# Patient Record
Sex: Male | Born: 1995 | Race: White | Hispanic: No | Marital: Single | Smoking: Former smoker
Health system: Southern US, Community
[De-identification: ages and names within clinical notes are randomized; demographics above are authoritative.]

## PROBLEM LIST (undated history)

## (undated) HISTORY — PX: MYRINGOTOMY: SUR874

---
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
## 2017-01-20 DIAGNOSIS — K122 Cellulitis and abscess of mouth: Secondary | ICD-10-CM | POA: Diagnosis not present

## 2017-01-20 DIAGNOSIS — Z719 Counseling, unspecified: Secondary | ICD-10-CM | POA: Diagnosis not present

## 2017-01-20 DIAGNOSIS — Z6820 Body mass index (BMI) 20.0-20.9, adult: Secondary | ICD-10-CM | POA: Diagnosis not present

## 2017-03-08 DIAGNOSIS — A498 Other bacterial infections of unspecified site: Secondary | ICD-10-CM | POA: Diagnosis not present

## 2017-03-08 DIAGNOSIS — Z1389 Encounter for screening for other disorder: Secondary | ICD-10-CM | POA: Diagnosis not present

## 2017-03-08 DIAGNOSIS — J302 Other seasonal allergic rhinitis: Secondary | ICD-10-CM | POA: Diagnosis not present

## 2017-03-08 DIAGNOSIS — J343 Hypertrophy of nasal turbinates: Secondary | ICD-10-CM | POA: Diagnosis not present

## 2017-11-19 DIAGNOSIS — J069 Acute upper respiratory infection, unspecified: Secondary | ICD-10-CM | POA: Diagnosis not present

## 2017-11-19 DIAGNOSIS — Z6822 Body mass index (BMI) 22.0-22.9, adult: Secondary | ICD-10-CM | POA: Diagnosis not present

## 2018-11-22 ENCOUNTER — Emergency Department (HOSPITAL_COMMUNITY): Payer: 59

## 2018-11-22 ENCOUNTER — Emergency Department (HOSPITAL_COMMUNITY)
Admission: EM | Admit: 2018-11-22 | Discharge: 2018-11-22 | Disposition: A | Discharge status: Home / Self Care | Payer: 59 | Attending: Emergency Medicine | Admitting: Emergency Medicine

## 2018-11-22 ENCOUNTER — Other Ambulatory Visit: Payer: Self-pay

## 2018-11-22 ENCOUNTER — Encounter (HOSPITAL_COMMUNITY): Payer: Self-pay

## 2018-11-22 DIAGNOSIS — W19XXXA Unspecified fall, initial encounter: Secondary | ICD-10-CM | POA: Insufficient documentation

## 2018-11-22 DIAGNOSIS — S161XXA Strain of muscle, fascia and tendon at neck level, initial encounter: Secondary | ICD-10-CM | POA: Diagnosis not present

## 2018-11-22 DIAGNOSIS — F17210 Nicotine dependence, cigarettes, uncomplicated: Secondary | ICD-10-CM | POA: Diagnosis not present

## 2018-11-22 DIAGNOSIS — X58XXXA Exposure to other specified factors, initial encounter: Secondary | ICD-10-CM | POA: Diagnosis not present

## 2018-11-22 DIAGNOSIS — M542 Cervicalgia: Secondary | ICD-10-CM | POA: Diagnosis not present

## 2018-11-22 DIAGNOSIS — Y999 Unspecified external cause status: Secondary | ICD-10-CM | POA: Diagnosis not present

## 2018-11-22 DIAGNOSIS — Y9389 Activity, other specified: Secondary | ICD-10-CM | POA: Insufficient documentation

## 2018-11-22 DIAGNOSIS — Y929 Unspecified place or not applicable: Secondary | ICD-10-CM | POA: Diagnosis not present

## 2018-11-22 DIAGNOSIS — R0789 Other chest pain: Secondary | ICD-10-CM | POA: Diagnosis not present

## 2018-11-22 DIAGNOSIS — S1980XA Other specified injuries of unspecified part of neck, initial encounter: Secondary | ICD-10-CM | POA: Diagnosis present

## 2018-11-22 DIAGNOSIS — S199XXA Unspecified injury of neck, initial encounter: Secondary | ICD-10-CM | POA: Diagnosis not present

## 2018-11-22 DIAGNOSIS — S299XXA Unspecified injury of thorax, initial encounter: Secondary | ICD-10-CM | POA: Diagnosis not present

## 2018-11-22 DIAGNOSIS — S0003XA Contusion of scalp, initial encounter: Secondary | ICD-10-CM | POA: Insufficient documentation

## 2018-11-22 DIAGNOSIS — R079 Chest pain, unspecified: Secondary | ICD-10-CM | POA: Diagnosis not present

## 2018-11-22 MED ORDER — TRAMADOL HCL 50 MG PO TABS
100.0000 mg | ORAL_TABLET | Freq: Once | ORAL | Status: AC
  Administered 2018-11-22: 100 mg via ORAL
  Filled 2018-11-22: qty 2

## 2018-11-22 MED ORDER — CYCLOBENZAPRINE HCL 10 MG PO TABS: 10.0000 mg | ORAL_TABLET | Freq: Three times a day (TID) | ORAL | 0 refills | Status: DC

## 2018-11-22 MED ORDER — KETOROLAC TROMETHAMINE 10 MG PO TABS
10.0000 mg | ORAL_TABLET | Freq: Once | ORAL | Status: AC
  Administered 2018-11-22: 10 mg via ORAL
  Filled 2018-11-22: qty 1

## 2018-11-22 MED ORDER — CYCLOBENZAPRINE HCL 10 MG PO TABS
10.0000 mg | ORAL_TABLET | Freq: Once | ORAL | Status: AC
  Administered 2018-11-22: 10 mg via ORAL
  Filled 2018-11-22: qty 1

## 2018-11-22 MED ORDER — ONDANSETRON HCL 4 MG PO TABS
4.0000 mg | ORAL_TABLET | Freq: Once | ORAL | Status: AC
  Administered 2018-11-22: 4 mg via ORAL
  Filled 2018-11-22: qty 1

## 2018-11-22 MED ORDER — DICLOFENAC SODIUM 75 MG PO TBEC: 75.0000 mg | DELAYED_RELEASE_TABLET | Freq: Two times a day (BID) | ORAL | 0 refills | Status: DC

## 2018-11-22 NOTE — ED Notes (Signed)
Attempted a back flip yesterday and caused injury to head, neck and sternum

## 2018-11-22 NOTE — ED Notes (Signed)
ED Provider at bedside. 

## 2018-11-22 NOTE — Discharge Instructions (Addendum)
Your blood pressure is slightly elevated.  Please have this rechecked soon.  Your neurologic examination shows no acute deficits.  The CT scan of your neck shows no fracture, or dislocation.  The x-ray of your chest is negative for bony abnormality, or lung abnormality.  Your examination suggest muscle strain involving the muscles in the neck area and chest area.  The examination also suggest a bruise or contusion to the back of the scalp area.  Please use Flexeril 3 times daily for spasm pain. This medication may cause drowsiness. Please do not drink, drive, or participate in activity that requires concentration while taking this medication.  Use diclofenac 2 times daily with food.  Please see your primary physician or return to the emergency department if any changes in your condition, problems, or concerns.

## 2018-11-22 NOTE — ED Triage Notes (Signed)
Pt did a back flip yesterday and hit the top of his head and neck. States he got to work today and could not work due to the pain.

## 2018-11-22 NOTE — ED Provider Notes (Signed)
[facility] EMERGENCY DEPARTMENT Provider Note   CSN: [id number] Arrival date & time: 11/22/18  1713    History   Chief Complaint Chief Complaint  Patient presents with  . Neck Pain    HPI Geoffrey Short is a 23 y.o. male.     Patient is a 23 year old male who presents to the emergency department with a complaint of injury following a back flip.  The patient states that on yesterday February 17 he attempted to do a back flip.  He says he is not done a back flip since his teenage years.  He injured his neck, he fell and injured the back of his head, he also states that he injured the sternum and upper chest.  There was no loss of consciousness reported.  There was no difficulty with walking after the flip.  The patient states however that he had pain in his upper chest and down the sternal area.  He had some pain between his shoulders.  He had some headache pain as well.  The patient states he was finally able to go to sleep.  This morning he continued to have some discomfort, but attempted to go to work, but was unable to do his usual work duties.  He presents now for assistance with his pain and also assessment of his injury.  The patient denies being on any anticoagulation medications.  He is not had any previous operations or procedures involving the head neck or chest.  The history is provided by the patient.  Neck Pain  Associated symptoms: headaches   Associated symptoms: no chest pain and no photophobia     History reviewed. No pertinent past medical history.  There are no active problems to display for this patient.   Past Surgical History:  Procedure Laterality Date  . MYRINGOTOMY          Home Medications    Prior to Admission medications   Not on File    Family History No family history on file.  Social History Social History   Tobacco Use  . Smoking status: Current Every Day Smoker    Packs/day: 0.50  . Smokeless tobacco: Current User    Types:  Chew  Substance Use Topics  . Alcohol use: Yes    Comment: 2 beers a day   . Drug use: Not Currently     Allergies   Patient has no allergy information on record.   Review of Systems Review of Systems  Constitutional: Negative for activity change.       All ROS Neg except as noted in HPI  HENT: Negative for nosebleeds.   Eyes: Negative for photophobia and discharge.  Respiratory: Negative for cough, shortness of breath and wheezing.        Chest wall pain  Cardiovascular: Negative for chest pain and palpitations.  Gastrointestinal: Negative for abdominal pain and blood in stool.  Genitourinary: Negative for dysuria, frequency and hematuria.  Musculoskeletal: Positive for neck pain. Negative for arthralgias and back pain.  Skin: Negative.   Neurological: Positive for headaches. Negative for dizziness, seizures and speech difficulty.  Psychiatric/Behavioral: Negative for confusion and hallucinations.     Physical Exam Updated Vital Signs BP (!) 162/88 (BP Location: Right Arm)   Pulse (!) 108   Temp 98.7 F (37.1 C) (Oral)   Resp 15   Ht 6\' 2"  (1.88 m)   Wt 90.7 kg   SpO2 98%   BMI 25.68 kg/m   Physical Exam Vitals signs  and nursing note reviewed.  Constitutional:      General: He is not in acute distress.    Appearance: He is well-developed.  HENT:     Head: Normocephalic. Contusion present. No raccoon eyes or Battle's sign.      Right Ear: External ear normal.     Left Ear: External ear normal.  Eyes:     General: No scleral icterus.       Right eye: No discharge.        Left eye: No discharge.     Conjunctiva/sclera: Conjunctivae normal.  Neck:     Musculoskeletal: Neck supple.     Trachea: No tracheal deviation.  Cardiovascular:     Rate and Rhythm: Normal rate and regular rhythm.  Pulmonary:     Effort: Pulmonary effort is normal. No respiratory distress.     Breath sounds: Normal breath sounds. No stridor. No wheezing or rales.  Abdominal:      General: Bowel sounds are normal. There is no distension.     Palpations: Abdomen is soft.     Tenderness: There is no abdominal tenderness. There is no guarding or rebound.  Musculoskeletal:        General: No tenderness.     Cervical back: He exhibits spasm.       Back:     Comments: There is tenderness of the paraspinal area in the cervical region.  There is no palpable step-off of the cervical, thoracic, or lumbar spine.  Skin:    General: Skin is warm and dry.     Findings: No rash.  Neurological:     Mental Status: He is alert.     Cranial Nerves: No cranial nerve deficit (no facial droop, extraocular movements intact, no slurred speech).     Sensory: No sensory deficit.     Motor: No abnormal muscle tone or seizure activity.     Coordination: Coordination normal.      ED Treatments / Results  Labs (all labs ordered are listed, but only abnormal results are displayed) Labs Reviewed - No data to display  EKG None  Radiology No results found.  Procedures Procedures (including critical care time)  Medications Ordered in ED Medications  cyclobenzaprine (FLEXERIL) tablet 10 mg (has no administration in time range)  ketorolac (TORADOL) tablet 10 mg (has no administration in time range)  traMADol (ULTRAM) tablet 100 mg (has no administration in time range)  ondansetron (ZOFRAN) tablet 4 mg (has no administration in time range)     Initial Impression / Assessment and Plan / ED Course  I have reviewed the triage vital signs and the nursing notes.  Pertinent labs & imaging results that were available during my care of the patient were reviewed by me and considered in my medical decision making (see chart for details).          Final Clinical Impressions(s) / ED Diagnoses MDM  Vital signs reviewed.  Pulse oximetry is 98% on room air.  Within normal limits by my interpretation.  Patient treated in the emergency department for his pain.  I explained to the  patient and family the testing that would be obtained, family is in agreement at this time.  X-ray of the chest is negative for any acute traumatic injury.  In particular no cardiopulmonary changes noted.  CT scan of the cervical spine is negative for acute problem.  Patient states he feels a lot better after medication.  Patient will be treated for muscle strain in  the neck, as well as contusion of the scalp and chest wall pain.  Prescription for Flexeril and diclofenac given to the patient to use.  The patient is to follow-up with the primary physician or return to the emergency department if any changes in condition, problems, or concerns.   Final diagnoses:  Acute strain of neck muscle, initial encounter  Contusion of scalp, initial encounter  Chest wall pain    ED Discharge Orders         Ordered    cyclobenzaprine (FLEXERIL) 10 MG tablet  3 times daily     11/22/18 2111    diclofenac (VOLTAREN) 75 MG EC tablet  2 times daily     11/22/18 2111           Ivery Quale, PA-C 11/23/18 0041    Jacalyn Lefevre, MD 11/23/18 1549

## 2018-11-22 NOTE — ED Notes (Signed)
Patient transported to CT 

## 2018-11-25 DIAGNOSIS — J029 Acute pharyngitis, unspecified: Secondary | ICD-10-CM | POA: Diagnosis not present

## 2018-11-25 DIAGNOSIS — Z6823 Body mass index (BMI) 23.0-23.9, adult: Secondary | ICD-10-CM | POA: Diagnosis not present

## 2019-10-03 ENCOUNTER — Ambulatory Visit: Payer: Private Health Insurance - Indemnity | Attending: Internal Medicine

## 2019-10-03 ENCOUNTER — Other Ambulatory Visit: Payer: Self-pay

## 2019-10-03 DIAGNOSIS — Z20822 Contact with and (suspected) exposure to covid-19: Secondary | ICD-10-CM

## 2019-10-04 LAB — NOVEL CORONAVIRUS, NAA: SARS-CoV-2, NAA: NOT DETECTED

## 2020-03-02 ENCOUNTER — Encounter (HOSPITAL_COMMUNITY): Payer: Self-pay | Admitting: *Deleted

## 2020-03-02 ENCOUNTER — Emergency Department (HOSPITAL_COMMUNITY)
Admission: EM | Admit: 2020-03-02 | Discharge: 2020-03-02 | Disposition: A | Discharge status: Home / Self Care | Payer: 59 | Attending: Emergency Medicine | Admitting: Emergency Medicine

## 2020-03-02 ENCOUNTER — Other Ambulatory Visit: Payer: Self-pay

## 2020-03-02 ENCOUNTER — Emergency Department (HOSPITAL_COMMUNITY): Payer: 59

## 2020-03-02 DIAGNOSIS — Z79899 Other long term (current) drug therapy: Secondary | ICD-10-CM | POA: Insufficient documentation

## 2020-03-02 DIAGNOSIS — Y9389 Activity, other specified: Secondary | ICD-10-CM | POA: Insufficient documentation

## 2020-03-02 DIAGNOSIS — F17220 Nicotine dependence, chewing tobacco, uncomplicated: Secondary | ICD-10-CM | POA: Insufficient documentation

## 2020-03-02 DIAGNOSIS — X509XXA Other and unspecified overexertion or strenuous movements or postures, initial encounter: Secondary | ICD-10-CM | POA: Insufficient documentation

## 2020-03-02 DIAGNOSIS — M542 Cervicalgia: Secondary | ICD-10-CM | POA: Insufficient documentation

## 2020-03-02 DIAGNOSIS — R202 Paresthesia of skin: Secondary | ICD-10-CM | POA: Insufficient documentation

## 2020-03-02 DIAGNOSIS — M25511 Pain in right shoulder: Secondary | ICD-10-CM | POA: Insufficient documentation

## 2020-03-02 DIAGNOSIS — Y9273 Farm field as the place of occurrence of the external cause: Secondary | ICD-10-CM | POA: Diagnosis not present

## 2020-03-02 MED ORDER — IBUPROFEN 800 MG PO TABS: 800.0000 mg | ORAL_TABLET | Freq: Three times a day (TID) | ORAL | 0 refills | Status: DC

## 2020-03-02 MED ORDER — HYDROCODONE-ACETAMINOPHEN 5-325 MG PO TABS: ORAL_TABLET | ORAL | 0 refills | Status: DC

## 2020-03-02 MED ORDER — IBUPROFEN 800 MG PO TABS: 800.0000 mg | ORAL_TABLET | Freq: Once | ORAL | Status: DC

## 2020-03-02 NOTE — ED Triage Notes (Signed)
Pt had a cow hit the gate while pt holding the gate causing shoulder pain and arm going limp.  Unable to lift arm and when moving his fingers causes pain to radiate up arm.

## 2020-03-02 NOTE — Discharge Instructions (Signed)
Your x-ray today was negative for fracture or dislocation.  You may have a ligamentous injury of your shoulder.  This will likely need orthopedic follow-up.  You may call Dr. Mort Sawyers office next week to arrange follow-up appointment.  Apply ice packs on and off your shoulder.

## 2020-03-02 NOTE — ED Provider Notes (Signed)
[facility] EMERGENCY DEPARTMENT Provider Note   CSN: [id number] Arrival date & time: 03/02/20  1351     History Chief Complaint  Patient presents with  . Shoulder Pain    Geoffrey RUDZINSKI is a 24 y.o. male.  HPI      Geoffrey Short is a 24 y.o. male who presents to the Emergency Department complaining of right shoulder pain secondary to an injury that occurred 2 hours prior to ER arrival.  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  He states that his shoulder was "slammed backwards" and he felt a pop in his shoulder and his arm suddenly went limp.  Since the incident, he reports that he is able to move his arm slightly but has pain when he tries to raise his arm outward.  He describes a throbbing pain along his posterior shoulder and into the muscles of his right neck.  He also describes a tingling sensation from his fingers that radiates toward his shoulder with gripping objects.  No prior shoulder injury to the right shoulder.  He denies head injury or LOC, neck pain or dizziness, chest or rib pain, or back pain.  No therapies prior to arrival.  History reviewed. No pertinent past medical history.  There are no problems to display for this patient.   Past Surgical History:  Procedure Laterality Date  . MYRINGOTOMY         History reviewed. No pertinent family history.  Social History   Tobacco Use  . Smoking status: Former Smoker    Packs/day: 0.50    Quit date: 10/20/2019    Years since quitting: 0.3  . Smokeless tobacco: Current User    Types: Chew  Substance Use Topics  . Alcohol use: Yes    Comment: 2 beers a day   . Drug use: Not Currently    Home Medications Prior to Admission medications   Medication Sig Start Date End Date Taking? Authorizing Provider  cyclobenzaprine (FLEXERIL) 10 MG tablet Take 1 tablet (10 mg total) by  mouth 3 (three) times daily. 11/22/18   Ivery Quale, PA-C  diclofenac (VOLTAREN) 75 MG EC tablet Take 1 tablet (75 mg total) by mouth 2 (two) times daily. 11/22/18   Ivery Quale, PA-C    Allergies    Patient has no known allergies.  Review of Systems   Review of Systems  Constitutional: Negative for chills and fever.  Eyes: Negative for visual disturbance.  Respiratory: Negative for shortness of breath.   Cardiovascular: Negative for chest pain.  Musculoskeletal: Positive for arthralgias (right shoulder pain) and neck pain (right neck pain). Negative for joint swelling.  Skin: Negative for color change, rash and wound.  Neurological: Negative for dizziness, syncope, weakness, numbness and headaches.    Physical Exam Updated Vital Signs BP 136/89 (BP Location: Left Arm)   Pulse 79   Temp (!) 97.1 F (36.2 C) (Temporal)   Resp 16   Ht 6\' 2"  (1.88 m)   Wt 98.9 kg   SpO2 97%   BMI 27.99 kg/m   Physical Exam Vitals and nursing note reviewed.  Constitutional:      Appearance: Normal appearance. He is not ill-appearing.  HENT:     Head: Atraumatic.  Neck:     Comments: No midline tenderness of the cervical spine. Cardiovascular:     Rate  and Rhythm: Normal rate and regular rhythm.     Pulses: Normal pulses.  Pulmonary:     Effort: Pulmonary effort is normal.     Breath sounds: Normal breath sounds.  Chest:     Chest wall: No tenderness.  Abdominal:     Palpations: Abdomen is soft.     Tenderness: There is no abdominal tenderness.  Musculoskeletal:        General: Tenderness and signs of injury present. No swelling or deformity.     Cervical back: Normal range of motion. Tenderness present.     Comments: Tender to palpation of the posterior right shoulder joint.  No bony step-off.  Mild tenderness to the right trapezius muscles.  No edema.  Pain reproduced with abduction.  Patient unable to abduct the right arm greater than 45 degrees without significant pain.  Right  elbow and wrist are nontender. Bilateral grip strength is strong and equal  Skin:    General: Skin is warm.     Capillary Refill: Capillary refill takes less than 2 seconds.     Findings: No erythema.  Neurological:     General: No focal deficit present.     Mental Status: He is alert.     Sensory: No sensory deficit.     Motor: No weakness.     ED Results / Procedures / Treatments   Labs (all labs ordered are listed, but only abnormal results are displayed) Labs Reviewed - No data to display  EKG None  Radiology DG Shoulder Right  Result Date: 03/02/2020 CLINICAL DATA:  Slammed into trailer gate EXAM: RIGHT SHOULDER - 2+ VIEW COMPARISON:  None. FINDINGS: Oblique and Y scapular images were obtained. No fracture or dislocation. Joint spaces appear normal. No erosive change. Visualized right lung clear. IMPRESSION: No fracture or dislocation.  No evident arthropathy. Electronically Signed   By: Lowella Grip III M.D.   On: 03/02/2020 14:43    Procedures Procedures (including critical care time)  Medications Ordered in ED Medications  ibuprofen (ADVIL) tablet 800 mg (has no administration in time range)    ED Course  I have reviewed the triage vital signs and the nursing notes.  Pertinent labs & imaging results that were available during my care of the patient were reviewed by me and considered in my medical decision making (see chart for details).    MDM Rules/Calculators/A&P                      Patient with mechanical injury of the right shoulder.  States that he felt a pop initially and another pop when he attempted to move his shoulder.  This may have represented a temporary dislocation with spontaneous reduction.  X-ray of the right shoulder today is negative for acute fracture or dislocation.  X-ray imaging reviewed by me.  He remains neurovascularly intact.  I have discussed possible ligamentous injury and the importance of close orthopedic follow-up.  He  verbalized understanding and agrees to this plan.  Short course of pain medication and anti-inflammatory provided.  Patient will be given referral information for local orthopedics.  Final Clinical Impression(s) / ED Diagnoses Final diagnoses:  Acute pain of right shoulder    Rx / DC Orders ED Discharge Orders    None       Kem Parkinson, PA-C 03/02/20 1609    Veryl Speak, MD 03/02/20 2031

## 2020-03-08 ENCOUNTER — Ambulatory Visit: Payer: 59 | Admitting: Orthopedic Surgery

## 2020-03-08 ENCOUNTER — Other Ambulatory Visit: Payer: Self-pay

## 2020-03-08 ENCOUNTER — Encounter: Payer: Self-pay | Admitting: Orthopedic Surgery

## 2020-03-08 VITALS — BP 137/81 | HR 84 | Ht 74.0 in | Wt 200.0 lb

## 2020-03-08 DIAGNOSIS — S43004A Unspecified dislocation of right shoulder joint, initial encounter: Secondary | ICD-10-CM

## 2020-03-08 NOTE — Patient Instructions (Signed)
Follow work 2 weeks

## 2020-03-08 NOTE — Progress Notes (Signed)
Chief Complaint  Patient presents with  . Shoulder Injury    ? 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  Since the shoulder was reduced no other treatment was recommended  He did have initial symptoms of numbness in his right arm which have resolved  Review of systems is negative  Review of Systems  All other systems reviewed and are negative.  BP 137/81   Pulse 84   Ht 6\' 2"  (1.88 m)   Wt 200 lb (90.7 kg)   BMI 25.68 kg/m   Normal development grooming and hygiene awake alert and oriented x3 normal appearance  Primary pain in the shoulder is reproduced with flexion adduction and posterior stress, he has pain in the central area of the shoulder with abduction external rotation but is apprehension test is negative he has a firm endpoint with no laxity.  His neurovascular exam is intact  He has normal external rotation passive flexion and abduction  Outside x-ray personal interpretation: X-ray AP lateral right shoulder shows no fracture dislocation  Recommend rest out of work 2 weeks follow-up in 2 weeks.  Okay to call if he feels better to go back to work

## 2020-03-11 ENCOUNTER — Telehealth: Payer: Self-pay | Admitting: Orthopedic Surgery

## 2020-03-11 NOTE — Telephone Encounter (Signed)
Called patient regarding forms received, FMLA and [employer] short-term disability; discussed [employer] forms process; voiced understanding, and will come to office tomorrow to fill out authorization forms.

## 2020-03-22 ENCOUNTER — Other Ambulatory Visit: Payer: Self-pay

## 2020-03-22 ENCOUNTER — Encounter: Payer: Self-pay | Admitting: Orthopedic Surgery

## 2020-03-22 ENCOUNTER — Ambulatory Visit (INDEPENDENT_AMBULATORY_CARE_PROVIDER_SITE_OTHER): Payer: 59 | Admitting: Orthopedic Surgery

## 2020-03-22 VITALS — BP 148/113 | HR 82 | Ht 74.0 in | Wt 200.0 lb

## 2020-03-22 DIAGNOSIS — S43004D Unspecified dislocation of right shoulder joint, subsequent encounter: Secondary | ICD-10-CM

## 2020-03-22 NOTE — Progress Notes (Signed)
Chief Complaint  Patient presents with   Shoulder Pain    better/ ready to return to work, injury date 03/02/20    24 yo male c/o a catch occasionally but no pain;   Stable in ABD EXT ROT and INT ROT ADD.  5/5 cuff strength   NVI   Encounter Diagnosis  Name Primary?   Dislocation of right shoulder joint, subsequent encounter Yes    Return to work Monday

## 2020-03-22 NOTE — Patient Instructions (Signed)
Ret to work Monday

## 2020-12-26 ENCOUNTER — Ambulatory Visit (HOSPITAL_COMMUNITY)
Admission: RE | Admit: 2020-12-26 | Discharge: 2020-12-26 | Disposition: A | Discharge status: Home / Self Care | Payer: 59 | Source: Ambulatory Visit | Attending: Family Medicine | Admitting: Family Medicine

## 2020-12-26 ENCOUNTER — Other Ambulatory Visit (HOSPITAL_COMMUNITY): Payer: Self-pay | Admitting: Family Medicine

## 2020-12-26 DIAGNOSIS — M25552 Pain in left hip: Secondary | ICD-10-CM | POA: Insufficient documentation

## 2020-12-30 ENCOUNTER — Other Ambulatory Visit (HOSPITAL_COMMUNITY): Payer: Self-pay | Admitting: Family Medicine

## 2020-12-30 DIAGNOSIS — N50811 Right testicular pain: Secondary | ICD-10-CM

## 2020-12-30 DIAGNOSIS — N50812 Left testicular pain: Secondary | ICD-10-CM

## 2021-01-07 ENCOUNTER — Ambulatory Visit (HOSPITAL_COMMUNITY)
Admission: RE | Admit: 2021-01-07 | Discharge: 2021-01-07 | Disposition: A | Discharge status: Home / Self Care | Payer: 59 | Source: Ambulatory Visit | Attending: Family Medicine | Admitting: Family Medicine

## 2021-01-07 DIAGNOSIS — N50812 Left testicular pain: Secondary | ICD-10-CM | POA: Diagnosis present

## 2021-01-07 DIAGNOSIS — N50811 Right testicular pain: Secondary | ICD-10-CM | POA: Insufficient documentation

## 2021-05-26 IMAGING — DX DG HIP (WITH OR WITHOUT PELVIS) 2-3V*L*
3 series · 3 of 3 positions shown · non-contrast
Comparison: None.

CLINICAL DATA: Left hip pain

EXAM:
DG HIP (WITH OR WITHOUT PELVIS) 2-3V LEFT

[pelvis ap]
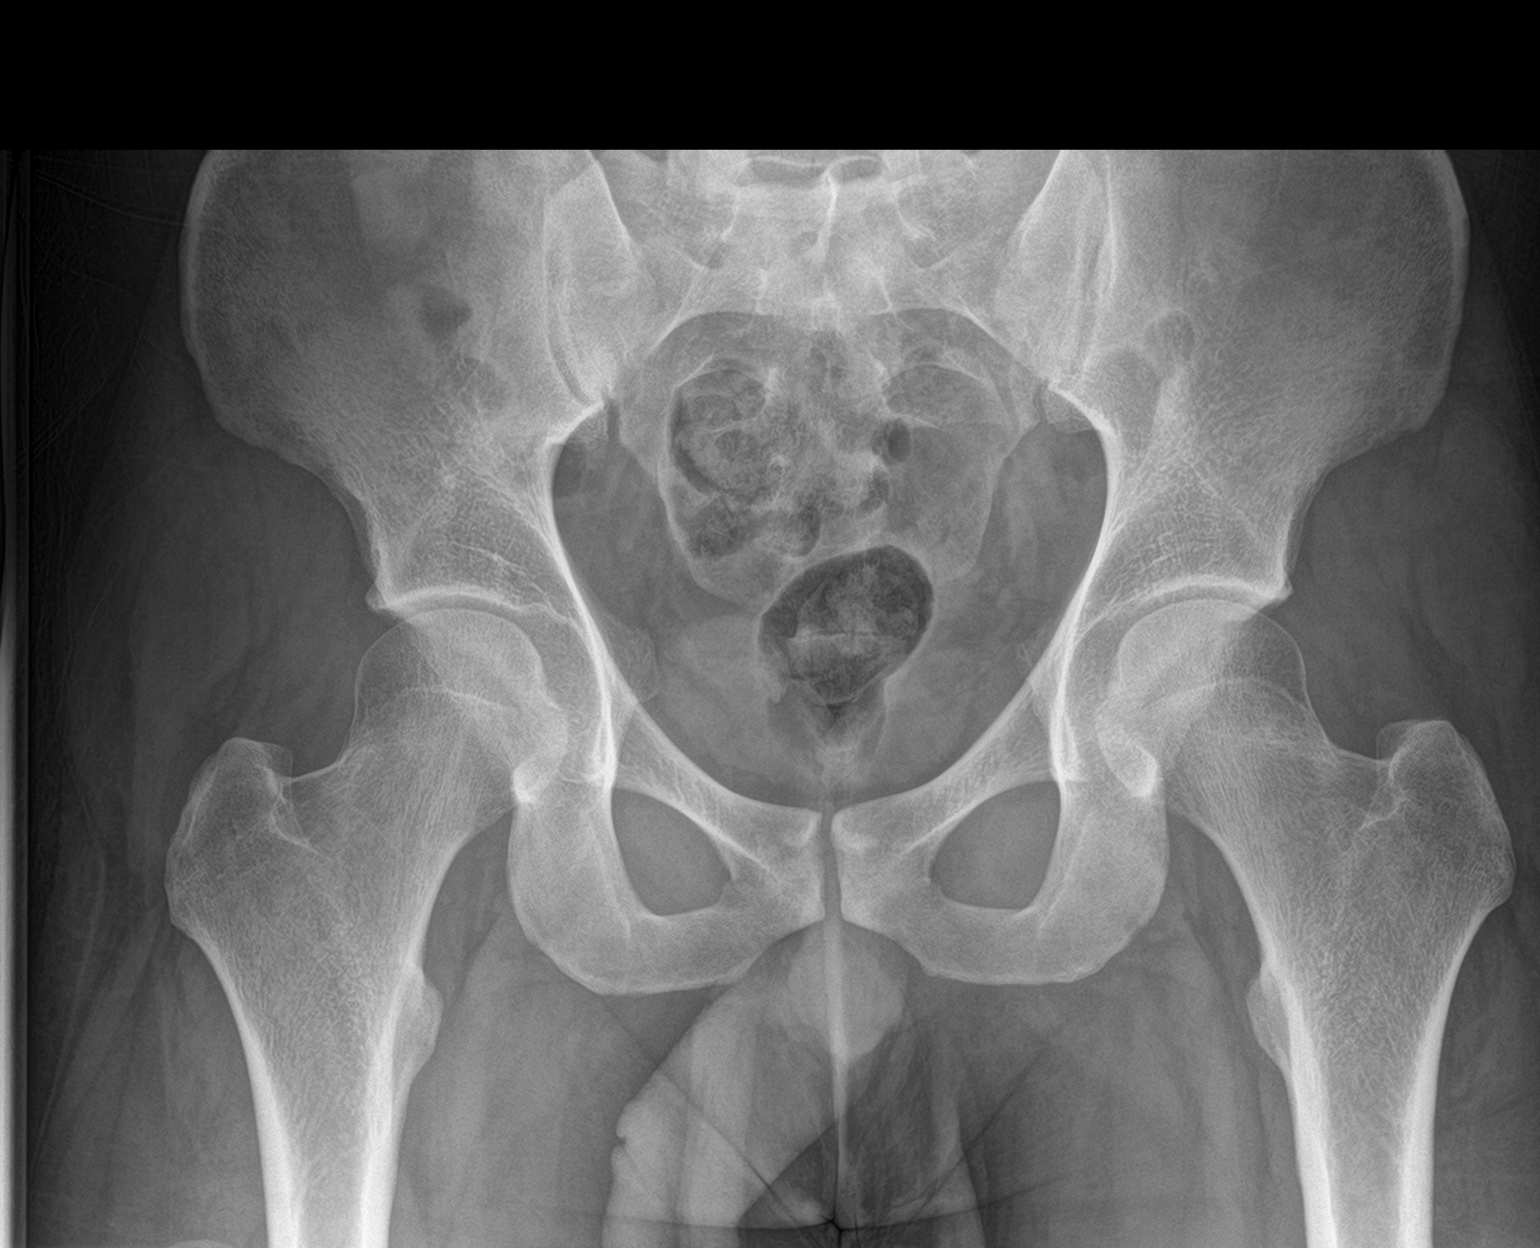

[hip ap]
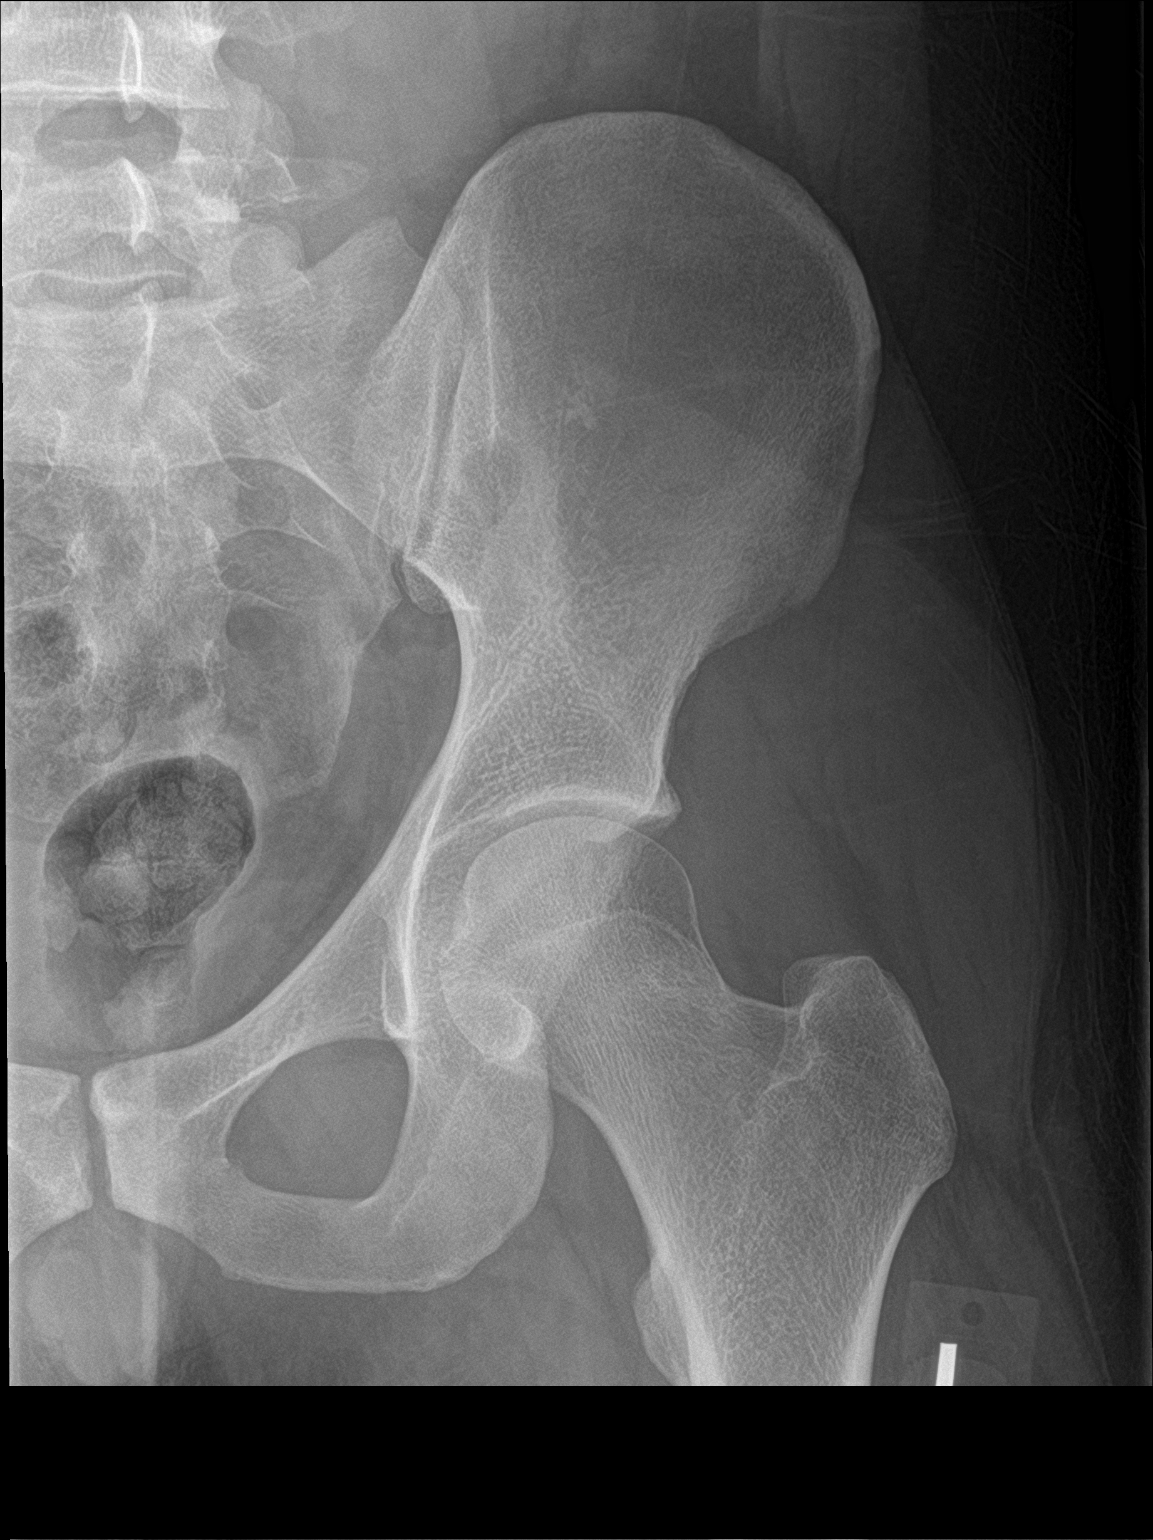

[hip lat]
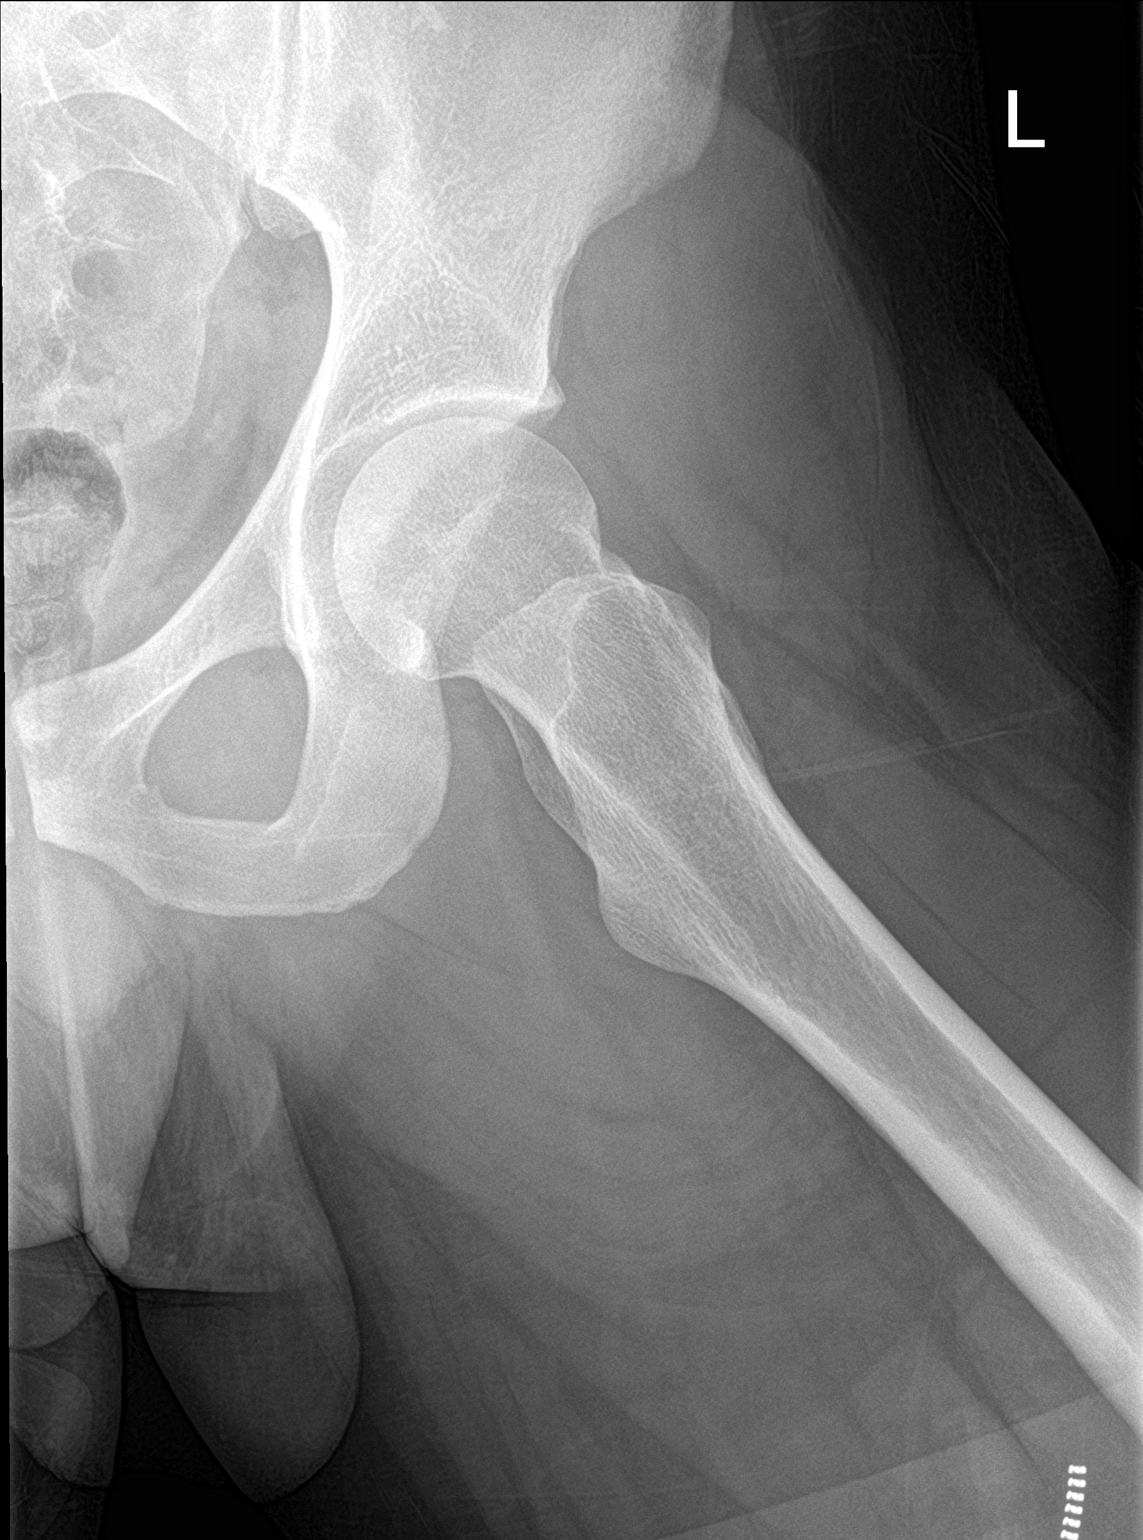

[3 of 3 positions shown; findings below may reference images not displayed]

FINDINGS: There is no evidence of hip fracture or dislocation. There is no
evidence of arthropathy or other focal bone abnormality.
IMPRESSION: Negative.

## 2022-12-03 ENCOUNTER — Encounter: Payer: Self-pay | Admitting: Radiology
